# Patient Record
Sex: Male | Born: 1970 | Race: White | Hispanic: No | Marital: Single | Smoking: Current every day smoker
Health system: Southern US, Community
[De-identification: ages and names within clinical notes are randomized; demographics above are authoritative.]

## PROBLEM LIST (undated history)

## (undated) DIAGNOSIS — C801 Malignant (primary) neoplasm, unspecified: Secondary | ICD-10-CM

## (undated) HISTORY — PX: TONSILLECTOMY: SUR1361

## (undated) HISTORY — PX: ADENOIDECTOMY: SUR15

---

## 2000-05-26 ENCOUNTER — Encounter: Payer: Self-pay | Admitting: Internal Medicine

## 2000-05-26 ENCOUNTER — Ambulatory Visit (HOSPITAL_COMMUNITY): Admission: RE | Admit: 2000-05-26 | Discharge: 2000-05-26 | Payer: Self-pay | Admitting: Internal Medicine

## 2003-10-26 ENCOUNTER — Emergency Department (HOSPITAL_COMMUNITY): Admission: EM | Admit: 2003-10-26 | Discharge: 2003-10-26 | Payer: Self-pay | Admitting: Emergency Medicine

## 2004-12-21 ENCOUNTER — Emergency Department (HOSPITAL_COMMUNITY): Admission: EM | Admit: 2004-12-21 | Discharge: 2004-12-21 | Payer: Self-pay | Admitting: *Deleted

## 2006-08-02 IMAGING — CR DG ELBOW COMPLETE 3+V*L*
2 series · 2 of 2 positions shown · non-contrast
Comparison: none

CLINICAL DATA: Patient fell on to his left arm and has pain along the elbow.
 LEFT ELBOW ? 4 VIEW:
 No prior studies.

[view not recorded (1 of 2)]
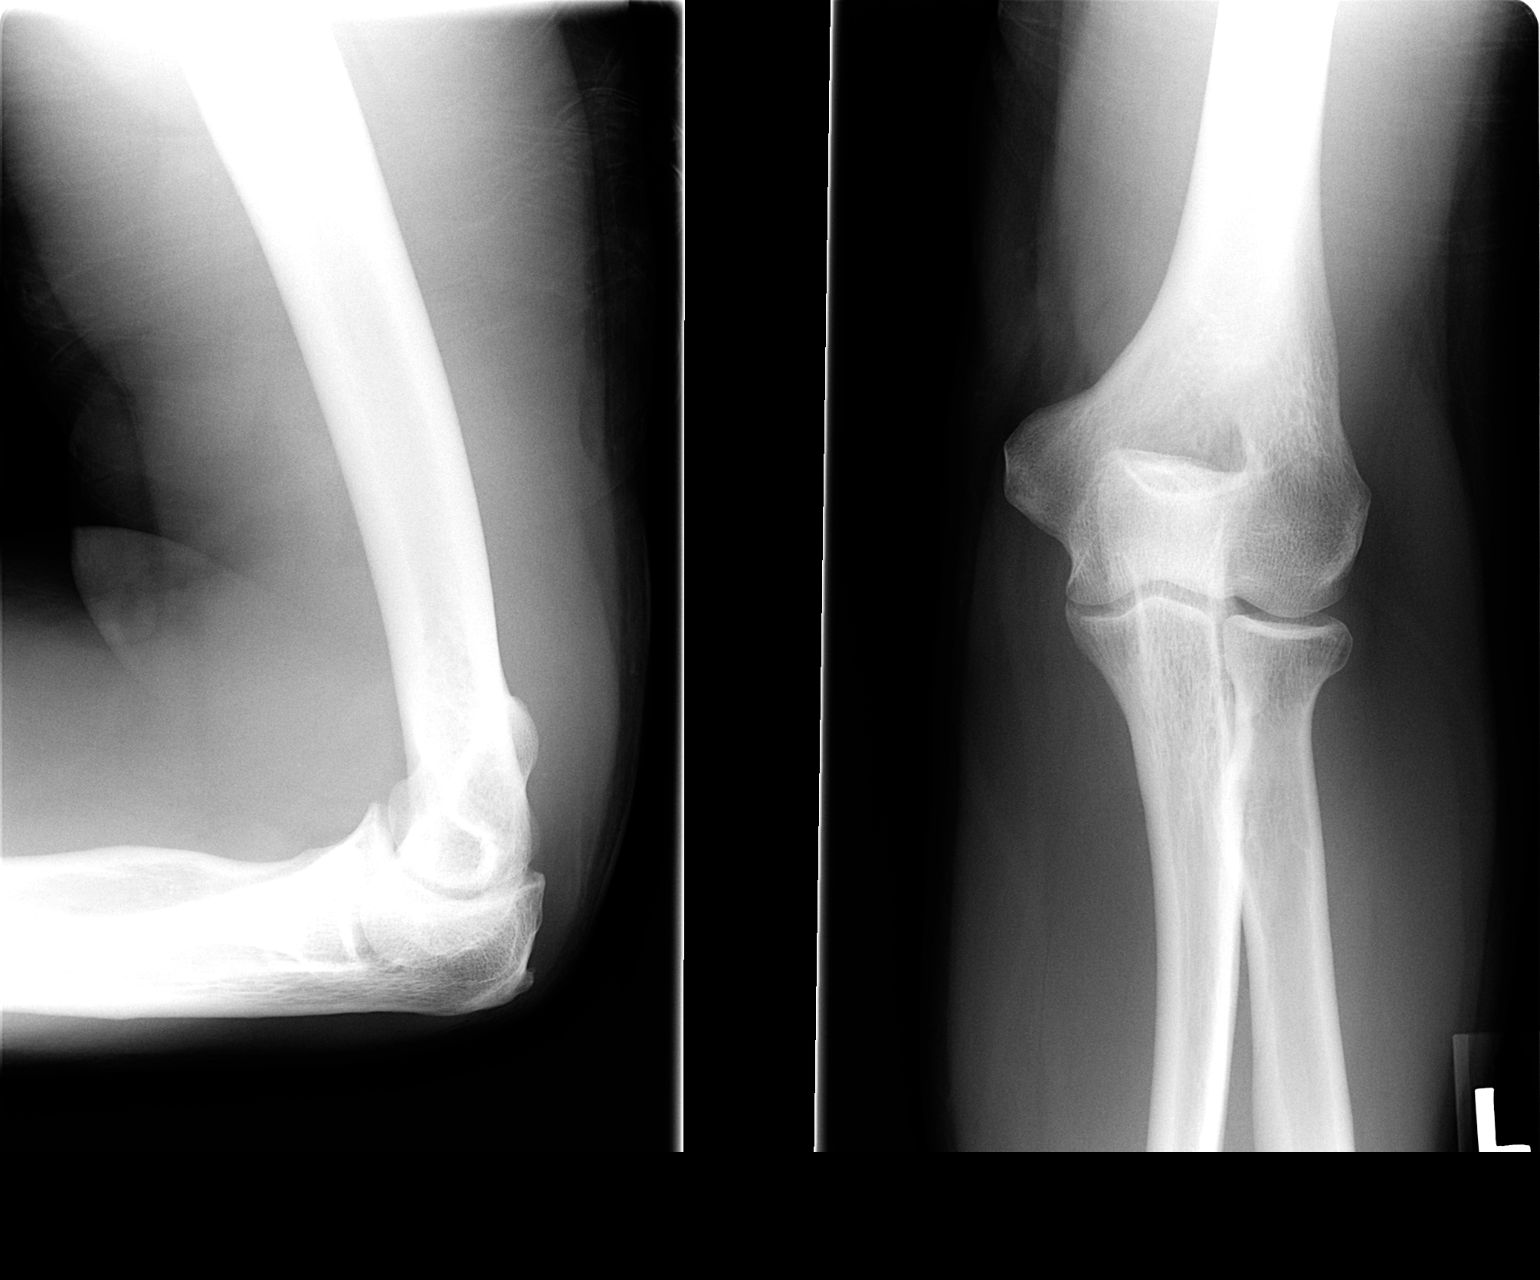

[view not recorded (2 of 2)]
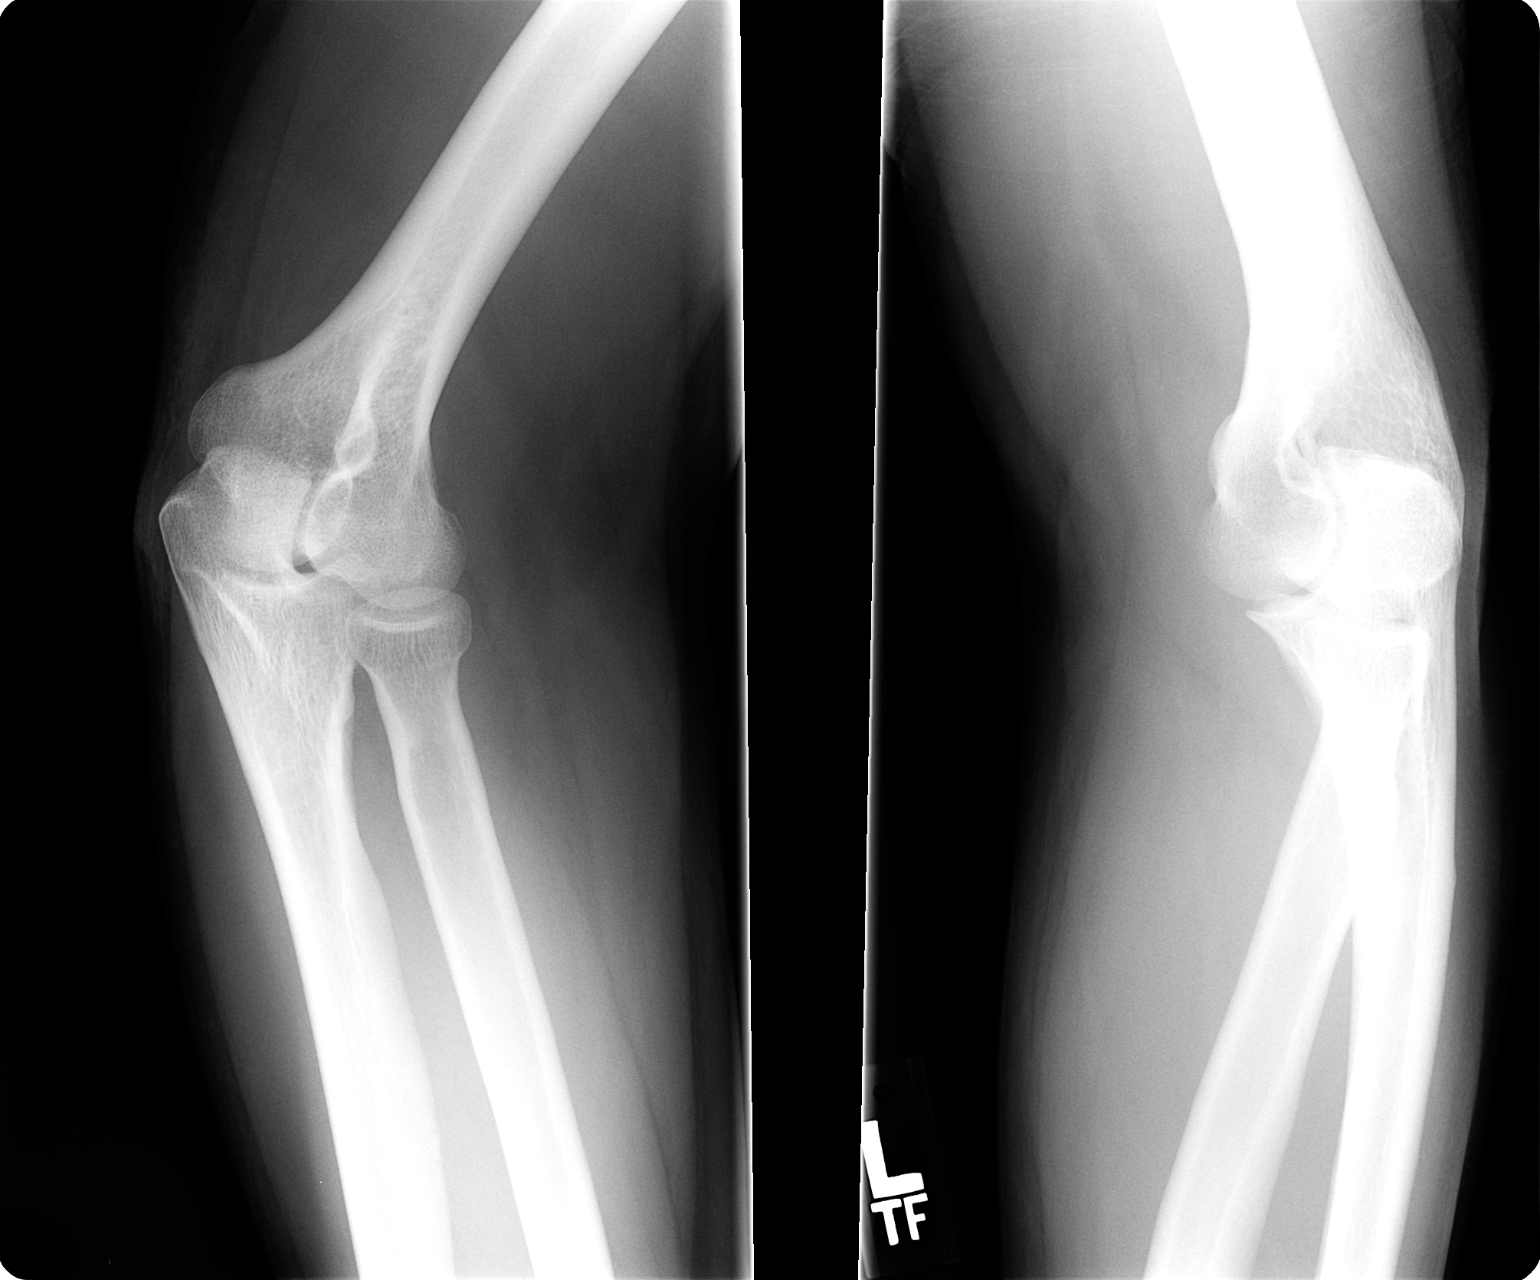

[2 of 2 positions shown; findings below may reference images not displayed]

FINDINGS: Anterior fat pad is slightly indistinct, but does not appear widened.  No posterior fat pad is visible to suggest elbow effusion.  
 There is mild spurring of the olecranon.  
 No radiographically visible fracture or dislocation.
IMPRESSION: No conventional radiographic evidence of fracture or elbow effusion.  If pain persists then follow-up imaging may be warranted.

## 2019-07-10 ENCOUNTER — Emergency Department (HOSPITAL_COMMUNITY): Payer: Medicaid Other

## 2019-07-10 ENCOUNTER — Encounter (HOSPITAL_COMMUNITY): Payer: Self-pay | Admitting: Emergency Medicine

## 2019-07-10 ENCOUNTER — Other Ambulatory Visit: Payer: Self-pay

## 2019-07-10 ENCOUNTER — Emergency Department (HOSPITAL_COMMUNITY)
Admission: EM | Admit: 2019-07-10 | Discharge: 2019-07-10 | Disposition: A | Discharge status: Home / Self Care | Payer: Medicaid Other | Attending: Emergency Medicine | Admitting: Emergency Medicine

## 2019-07-10 DIAGNOSIS — F17210 Nicotine dependence, cigarettes, uncomplicated: Secondary | ICD-10-CM | POA: Insufficient documentation

## 2019-07-10 DIAGNOSIS — C3491 Malignant neoplasm of unspecified part of right bronchus or lung: Secondary | ICD-10-CM | POA: Insufficient documentation

## 2019-07-10 DIAGNOSIS — R0602 Shortness of breath: Secondary | ICD-10-CM

## 2019-07-10 DIAGNOSIS — Z7901 Long term (current) use of anticoagulants: Secondary | ICD-10-CM | POA: Diagnosis not present

## 2019-07-10 HISTORY — DX: Malignant (primary) neoplasm, unspecified: C80.1

## 2019-07-10 LAB — CBC WITH DIFFERENTIAL/PLATELET
Abs Immature Granulocytes: 0.53 10*3/uL — ABNORMAL HIGH (ref 0.00–0.07)
Basophils Absolute: 0 10*3/uL (ref 0.0–0.1)
Basophils Relative: 1 %
Eosinophils Absolute: 0 10*3/uL (ref 0.0–0.5)
Eosinophils Relative: 0 %
HCT: 27.2 % — ABNORMAL LOW (ref 39.0–52.0)
Hemoglobin: 8.8 g/dL — ABNORMAL LOW (ref 13.0–17.0)
Immature Granulocytes: 15 %
Lymphocytes Relative: 19 %
Lymphs Abs: 0.7 10*3/uL (ref 0.7–4.0)
MCH: 31.7 pg (ref 26.0–34.0)
MCHC: 32.4 g/dL (ref 30.0–36.0)
MCV: 97.8 fL (ref 80.0–100.0)
Monocytes Absolute: 0.6 10*3/uL (ref 0.1–1.0)
Monocytes Relative: 16 %
Neutro Abs: 1.7 10*3/uL (ref 1.7–7.7)
Neutrophils Relative %: 49 %
Platelets: 139 10*3/uL — ABNORMAL LOW (ref 150–400)
RBC: 2.78 MIL/uL — ABNORMAL LOW (ref 4.22–5.81)
RDW: 21.9 % — ABNORMAL HIGH (ref 11.5–15.5)
WBC: 3.5 10*3/uL — ABNORMAL LOW (ref 4.0–10.5)
nRBC: 13.5 % — ABNORMAL HIGH (ref 0.0–0.2)

## 2019-07-10 LAB — COMPREHENSIVE METABOLIC PANEL
ALT: 49 U/L — ABNORMAL HIGH (ref 0–44)
AST: 26 U/L (ref 15–41)
Albumin: 3.4 g/dL — ABNORMAL LOW (ref 3.5–5.0)
Alkaline Phosphatase: 61 U/L (ref 38–126)
Anion gap: 11 (ref 5–15)
BUN: 34 mg/dL — ABNORMAL HIGH (ref 6–20)
CO2: 28 mmol/L (ref 22–32)
Calcium: 8.6 mg/dL — ABNORMAL LOW (ref 8.9–10.3)
Chloride: 100 mmol/L (ref 98–111)
Creatinine, Ser: 0.64 mg/dL (ref 0.61–1.24)
GFR calc Af Amer: >60 mL/min (ref >60)
GFR calc non Af Amer: >60 mL/min (ref >60)
Glucose, Bld: 117 mg/dL — ABNORMAL HIGH (ref 70–99)
Potassium: 4 mmol/L (ref 3.5–5.1)
Sodium: 139 mmol/L (ref 135–145)
Total Bilirubin: 0.6 mg/dL (ref 0.3–1.2)
Total Protein: 5.9 g/dL — ABNORMAL LOW (ref 6.5–8.1)

## 2019-07-10 LAB — LIPASE, BLOOD: Lipase: 16 U/L (ref 11–51)

## 2019-07-10 MED ORDER — IOHEXOL 350 MG/ML SOLN
100.0000 mL | Freq: Once | INTRAVENOUS | Status: AC | PRN
  Administered 2019-07-10: 100 mL via INTRAVENOUS

## 2019-07-10 MED ORDER — HEPARIN SOD (PORK) LOCK FLUSH 100 UNIT/ML IV SOLN
500.0000 [IU] | Freq: Once | INTRAVENOUS | Status: AC
  Administered 2019-07-10: 500 [IU] via INTRAVENOUS
  Filled 2019-07-10: qty 5

## 2019-07-10 NOTE — ED Triage Notes (Signed)
Patient c/o shortness of breath. Per patient has stage 4 lung cancer and gets short of breath with heat. Per patient has inhaler he uses once a day but makes shortness of breath worse. Patient has swelling in arms and legs bilaterally. Patient states side effect of chemo. Last Chemo treatment 3 weeks ago. Patient is to start new chemo session this Wednesday. Patient denies any chest pain or fevers. Patient does have congested cough with thick yellow sputum.

## 2019-07-10 NOTE — ED Provider Notes (Signed)
[facility] EMERGENCY DEPARTMENT Provider Note   CSN: [id number] Arrival date & time: 07/10/19  1556     History Chief Complaint  Patient presents with  . Shortness of Breath    Eddie English is a 49 y.o. male.  Patient has a history of metastatic small cell cancer undergoing chemotherapy.  This is being done at [facility] by [place].  Patient presenting here with a complaint of shortness of breath.  Patient states there is a congestive cough with thick yellow sputum.  Vital signs here afebrile.  Blood pressure 956 systolic.  Heart rate 96 respirations 18.  Oxygen sats 96%.  Patient is mostly concerned about diffuse swelling to upper extremities and lower extremities.  But he was told by his hematologist oncologist that is due to the chemo.  But he thinks is excessive.        Past Medical History:  Diagnosis Date  . Cancer ([place])     There are no problems to display for this patient.   Past Surgical History:  Procedure Laterality Date  . ADENOIDECTOMY    . TONSILLECTOMY         History reviewed. No pertinent family history.  Social History   Tobacco Use  . Smoking status: Current Every Day Smoker    Packs/day: 0.03    Years: 32.00    Pack years: 0.96    Types: Cigarettes  . Smokeless tobacco: Former Network engineer  . Vaping Use: Never used  Substance Use Topics  . Alcohol use: Not Currently  . Drug use: Never    Home Medications Prior to Admission medications   Medication Sig Start Date End Date Taking? Authorizing Provider  benzonatate (TESSALON) 100 MG capsule Take 1 capsule by mouth 3 (three) times daily. 05/21/19  Yes [provider]  dexamethasone (DECADRON) 4 MG tablet Take 4 mg by mouth 2 (two) times daily with a meal.   Yes [provider]  metoprolol tartrate (LOPRESSOR) 25 MG tablet Take 1 tablet by mouth 2 (two) times daily. 05/10/19  Yes [provider]  Rivaroxaban (XARELTO) 15 MG TABS tablet Take 15 mg by mouth 2  (two) times daily with a meal.   Yes [provider]  traMADol (ULTRAM) 50 MG tablet Take 50 mg by mouth every 6 (six) hours as needed.   Yes [provider]    Allergies    Patient has no known allergies.  Review of Systems   Review of Systems  Constitutional: Negative for chills and fever.  HENT: Negative for congestion, rhinorrhea and sore throat.   Eyes: Negative for visual disturbance.  Respiratory: Positive for shortness of breath. Negative for cough.   Cardiovascular: Positive for leg swelling. Negative for chest pain.  Gastrointestinal: Negative for abdominal pain, diarrhea, nausea and vomiting.  Genitourinary: Negative for dysuria.  Musculoskeletal: Negative for back pain and neck pain.  Skin: Negative for rash.  Neurological: Negative for dizziness, light-headedness and headaches.  Hematological: Does not bruise/bleed easily.  Psychiatric/Behavioral: Negative for confusion.    Physical Exam Updated Vital Signs BP 110/72   Pulse 100   Temp 97.7 F (36.5 C) (Oral)   Resp 13   Ht 1.778 m (5\' 10" )   Wt 78 kg   SpO2 100%   BMI 24.68 kg/m   Physical Exam Vitals and nursing note reviewed.  Constitutional:      Appearance: Normal appearance. He is well-developed.  HENT:     Head: Normocephalic and atraumatic.  Eyes:  Extraocular Movements: Extraocular movements intact.     Conjunctiva/sclera: Conjunctivae normal.     Pupils: Pupils are equal, round, and reactive to light.  Cardiovascular:     Rate and Rhythm: Normal rate and regular rhythm.     Heart sounds: No murmur heard.   Pulmonary:     Effort: Pulmonary effort is normal. No respiratory distress.     Breath sounds: Normal breath sounds.  Chest:     Chest wall: No tenderness.  Abdominal:     Palpations: Abdomen is soft.     Tenderness: There is no abdominal tenderness.  Musculoskeletal:        General: Swelling present. Normal range of motion.     Cervical back: Neck supple.    Skin:    General: Skin is warm and dry.     Capillary Refill: Capillary refill takes less than 2 seconds.  Neurological:     General: No focal deficit present.     Mental Status: He is alert and oriented to person, place, and time.     ED Results / Procedures / Treatments   Labs (all labs ordered are listed, but only abnormal results are displayed) Labs Reviewed  CBC WITH DIFFERENTIAL/PLATELET - Abnormal; Notable for the following components:      Result Value   WBC 3.5 (*)    RBC 2.78 (*)    Hemoglobin 8.8 (*)    HCT 27.2 (*)    RDW 21.9 (*)    Platelets 139 (*)    nRBC 13.5 (*)    Abs Immature Granulocytes 0.53 (*)    All other components within normal limits  COMPREHENSIVE METABOLIC PANEL - Abnormal; Notable for the following components:   Glucose, Bld 117 (*)    BUN 34 (*)    Calcium 8.6 (*)    Total Protein 5.9 (*)    Albumin 3.4 (*)    ALT 49 (*)    All other components within normal limits  LIPASE, BLOOD    EKG EKG Interpretation  Date/Time:  Saturday July 10 2019 16:31:59 EDT Ventricular Rate:  94 PR Interval:    QRS Duration: 89 QT Interval:  343 QTC Calculation: 429 R Axis:   57 Text Interpretation: Sinus rhythm Low voltage, precordial leads No previous ECGs available Confirmed by Fredia Sorrow [phone]) on 07/10/2019 4:38:04 PM   Radiology CT Angio Chest PE W/Cm &/Or Wo Cm  Result Date: 07/10/2019 CLINICAL DATA:  Shortness of breath.  Lung cancer. EXAM: CT ANGIOGRAPHY CHEST WITH CONTRAST TECHNIQUE: Multidetector CT imaging of the chest was performed using the standard protocol during bolus administration of intravenous contrast. Multiplanar CT image reconstructions and MIPs were obtained to evaluate the vascular anatomy. CONTRAST:  128mL OMNIPAQUE IOHEXOL 350 MG/ML SOLN COMPARISON:  Chest x-ray today FINDINGS: Cardiovascular: No filling defects in the pulmonary arteries to suggest pulmonary emboli. Heart is normal size. Aorta is normal caliber.  Mediastinum/Nodes: Abnormal infiltrating soft tissue in the right paratracheal region and right hilum without well-defined measurable nodes. Trachea and esophagus are unremarkable. Thyroid unremarkable. Lungs/Pleura: Band like scarring or atelectasis in the right upper lobe. Lungs otherwise clear. No effusions. Upper Abdomen: Multiple low-density lesions seen in the visualized liver, the largest measuring up to 6.8 cm compatible with metastases. Low-density areas in the spleen could reflect focal lesions or pseudo lesions based on early arterial phase imaging. Musculoskeletal: Irregular, predominantly sclerotic sternum. Sclerotic lesions noted within the mid and lower thoracic spine. Sclerotic lesion within the posterior right 10th rib. Review  of the MIP images confirms the above findings. IMPRESSION: No evidence of pulmonary embolus. Infiltrative soft tissue in the right paratracheal region, subcarinal region and right hilum. This could reflect tumor or post treatment changes. Band like atelectasis or scarring in the right upper lobe may be post treatment related. Multiple bony sclerotic lesions involving the sternum, thoracic spine, and posterior right 10th rib compatible with metastases. Multiple hepatic metastases. Electronically Signed   By: Rolm Baptise M.D.   On: 07/10/2019 20:52   DG Chest Port 1 View  Result Date: 07/10/2019 CLINICAL DATA:  Shortness of breath, cough productive of thick yellow sputum, history stage IV RIGHT-side lung cancer, swelling in arms and legs, last chemotherapy treatment 3 weeks ago, smoker EXAM: PORTABLE CHEST 1 VIEW COMPARISON:  Portable exam 1738 hours without priors for comparison FINDINGS: RIGHT jugular Port-A-Cath with tip projecting over cavoatrial junction. Normal heart size, mediastinal contours, and pulmonary vascularity. Volume loss in the RIGHT upper lobe with atelectasis and abnormal medial upper lobe density question related to reported tumor. Remaining lungs  clear. No acute infiltrate, pleural effusion, or pneumothorax. Osseous structures unremarkable. IMPRESSION: Volume loss and abnormal density in the RIGHT upper lobe likely reflecting tumor and atelectasis related to treatment; recommend correlation with prior imaging to confirm this is site of patient's known tumor. No acute infiltrates identified. Electronically Signed   By: Lavonia Dana M.D.   On: 07/10/2019 18:07    Procedures Procedures (including critical care time)  Medications Ordered in ED Medications  iohexol (OMNIPAQUE) 350 MG/ML injection 100 mL (100 mLs Intravenous Contrast Given 07/10/19 2028)    ED Course  I have reviewed the triage vital signs and the nursing notes.  Pertinent labs & imaging results that were available during my care of the patient were reviewed by me and considered in my medical decision making (see chart for details).    MDM Rules/Calculators/A&P                          Patient nontoxic no acute distress here.  Proceeded to do CT angio chest to further evaluate and rule out pulmonary embolus since he has a history of cancer.  That was negative.  Does not seem to be anything acute on the CT chest.  Patient's renal function without significant abnormalities.  Does have an anemia.  And some leukopenia.  But his absolute neutrophils are okay.  No fevers here.  Safe for discharge home follow-up with his hematologist oncologist about the extremity swelling.  Final Clinical Impression(s) / ED Diagnoses Final diagnoses:  SOB (shortness of breath)    Rx / DC Orders ED Discharge Orders    None       Fredia Sorrow, MD 07/10/19 2110

## 2019-07-10 NOTE — ED Notes (Signed)
Pt returned from CT Scan 

## 2019-07-10 NOTE — Discharge Instructions (Addendum)
Work-up for the shortness of breath without any acute findings including CT angio chest with no evidence of blood clot.  You do have the small cell cancer and you do have evidence of metastatic disease to the liver this was all known before.  Call your hematologist oncologist on Monday about the arm and leg swelling.  Return for any new or worse symptoms.

## 2019-07-20 ENCOUNTER — Other Ambulatory Visit (HOSPITAL_COMMUNITY): Payer: Self-pay | Admitting: Hematology and Oncology

## 2019-07-20 ENCOUNTER — Other Ambulatory Visit: Payer: Self-pay | Admitting: Hematology and Oncology

## 2019-07-20 DIAGNOSIS — C3490 Malignant neoplasm of unspecified part of unspecified bronchus or lung: Secondary | ICD-10-CM

## 2019-08-03 ENCOUNTER — Ambulatory Visit (HOSPITAL_COMMUNITY): Payer: Medicaid Other

## 2020-04-14 DEATH — deceased

## 2021-02-18 IMAGING — CT CT ANGIO CHEST
2 of 6 series · 18 of 46 positions shown · IV contrast (Omnipaque or Isovue)
Comparison: Chest x-ray today

CLINICAL DATA: Shortness of breath.  Lung cancer.

EXAM:
CT ANGIOGRAPHY CHEST WITH CONTRAST
TECHNIQUE: Multidetector CT imaging of the chest was performed using the
standard protocol during bolus administration of intravenous
contrast. Multiplanar CT image reconstructions and MIPs were
obtained to evaluate the vascular anatomy.
CONTRAST:  100mL OMNIPAQUE IOHEXOL 350 MG/ML SOLN

[Series 5: pe axial thins · axial · 0.89mm/px · z∈[+1327,+1590]mm · 15 of 288 slices shown]
[im 13/288  lung]
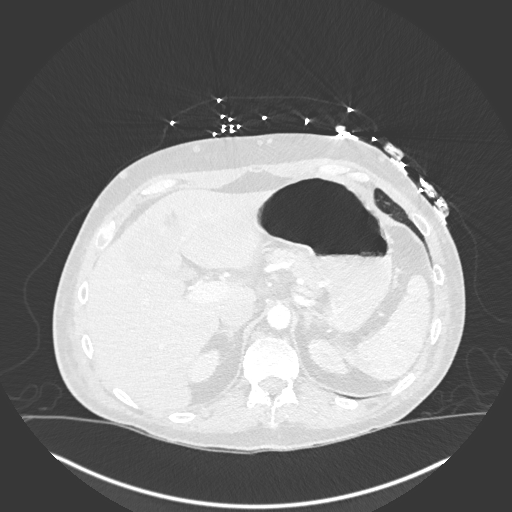
[im 38/288  soft-tissue]
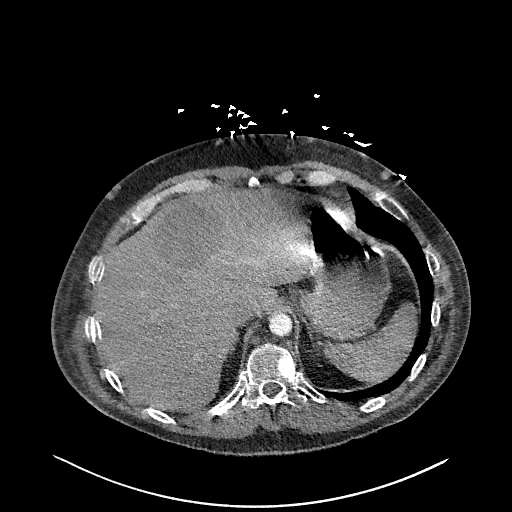
[im 50/288  lung]
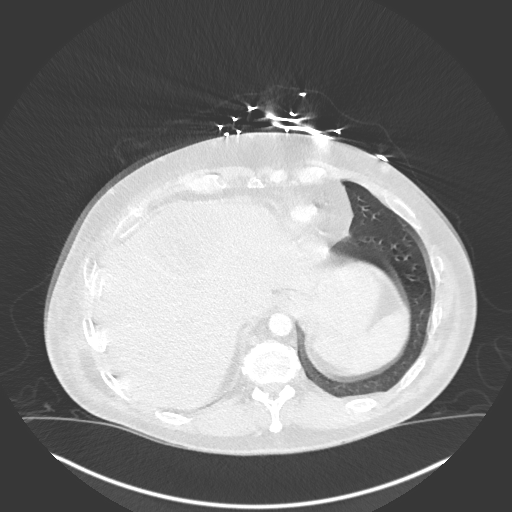
[im 75/288  soft-tissue]
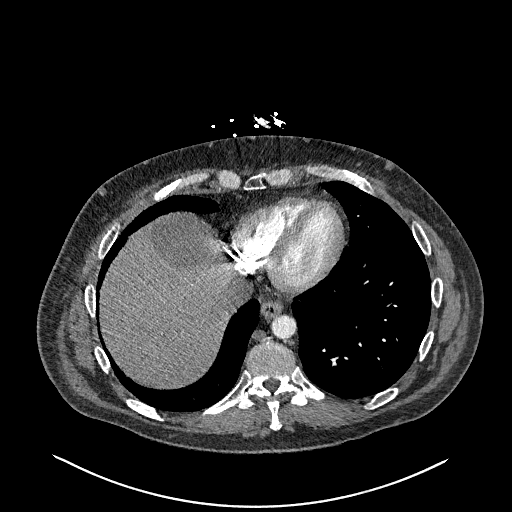
[im 88/288  lung]
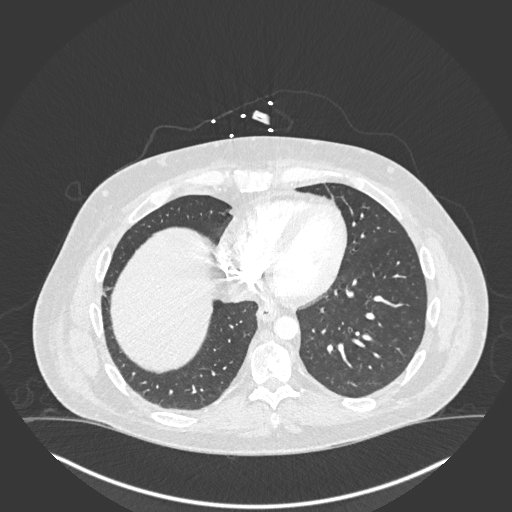
[im 113/288  soft-tissue]
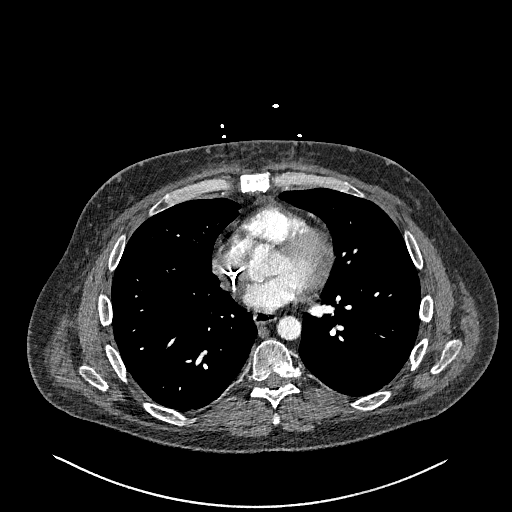
[im 125/288  lung]
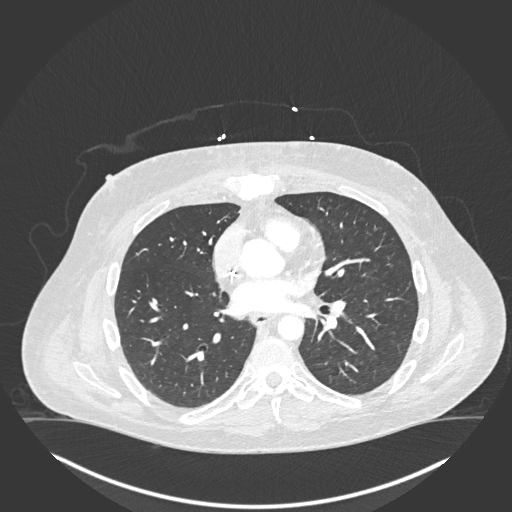
[im 150/288  soft-tissue]
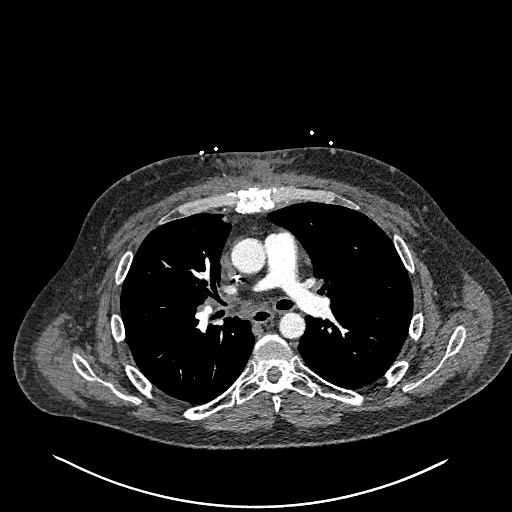
[im 163/288  lung]
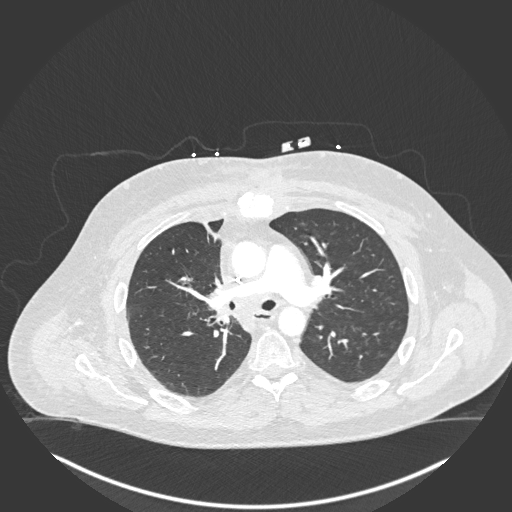
[im 175/288  soft-tissue]
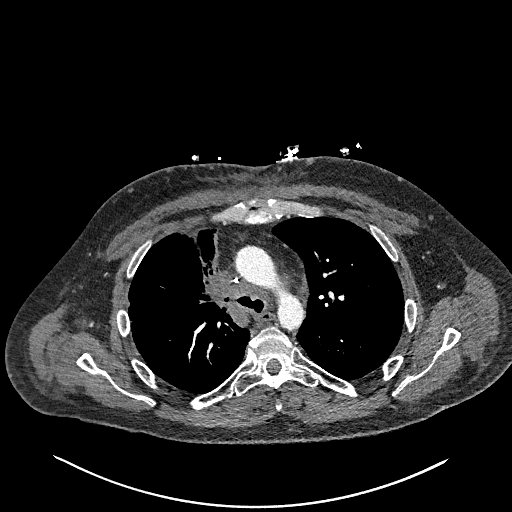
[im 200/288  lung]
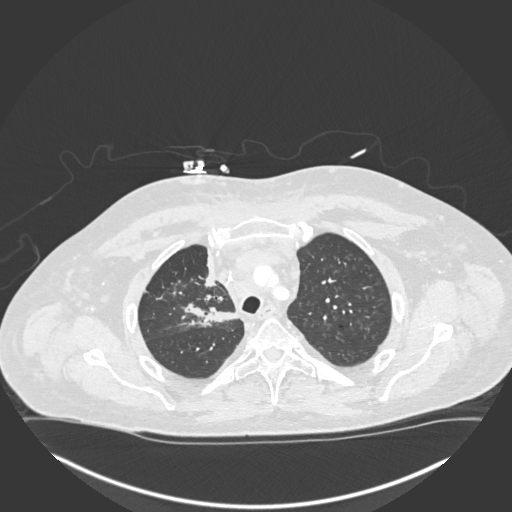
[im 213/288  soft-tissue]
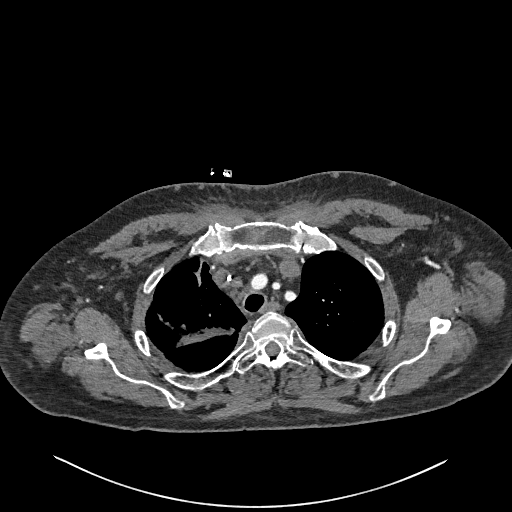
[im 238/288  lung]
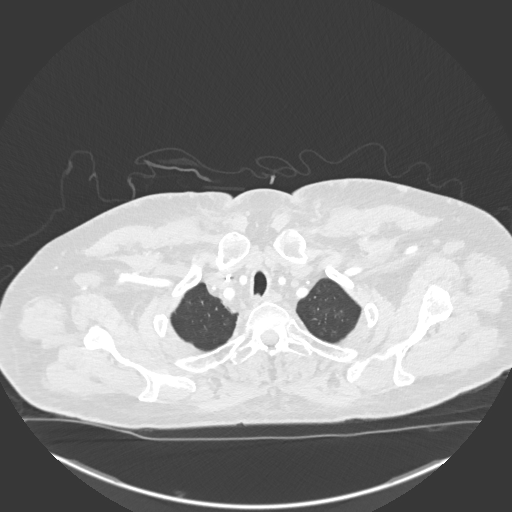
[im 250/288  soft-tissue]
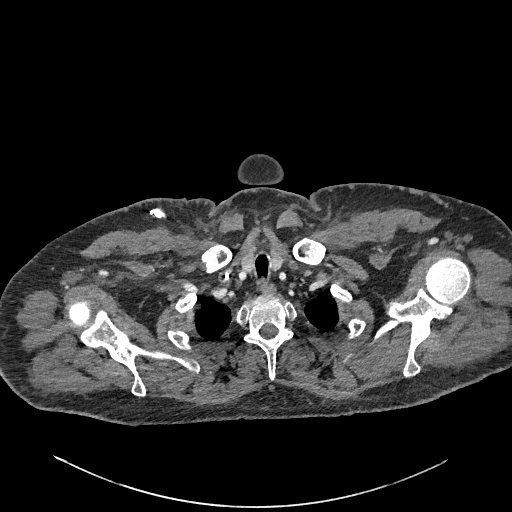
[im 275/288  lung]
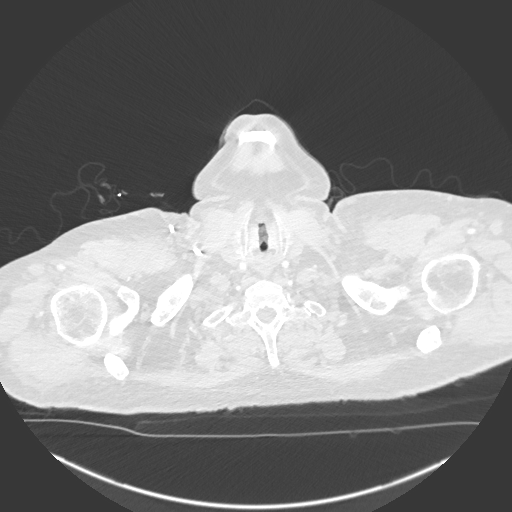

[Series 7: cor soft · coronal · 0.61mm/px · 3 of 166 slices shown]
[im 42/166  soft-tissue]
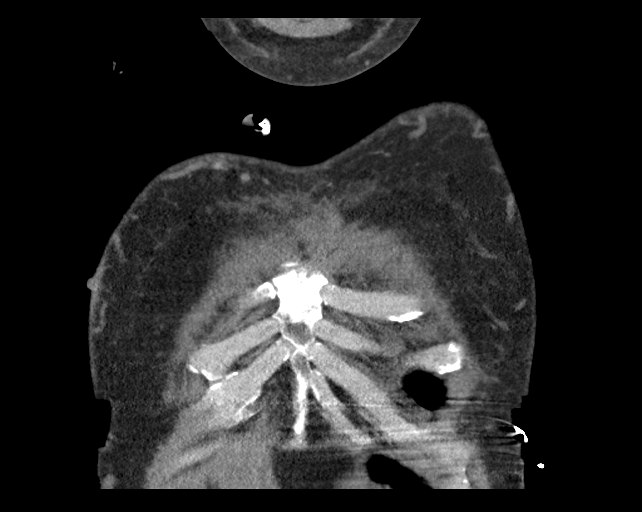
[im 83/166  soft-tissue]
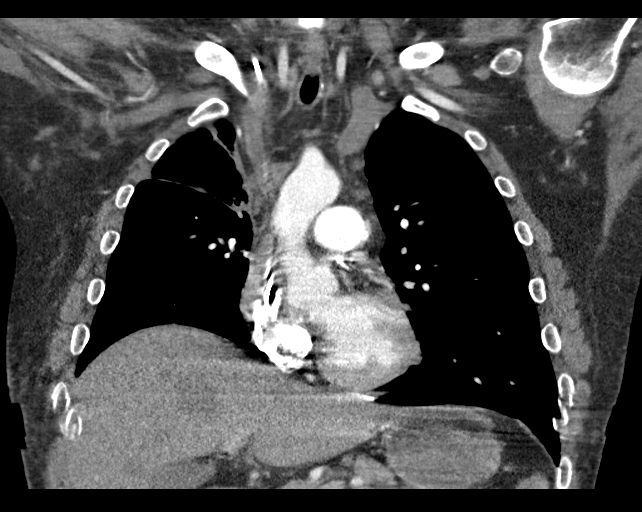
[im 124/166  soft-tissue]
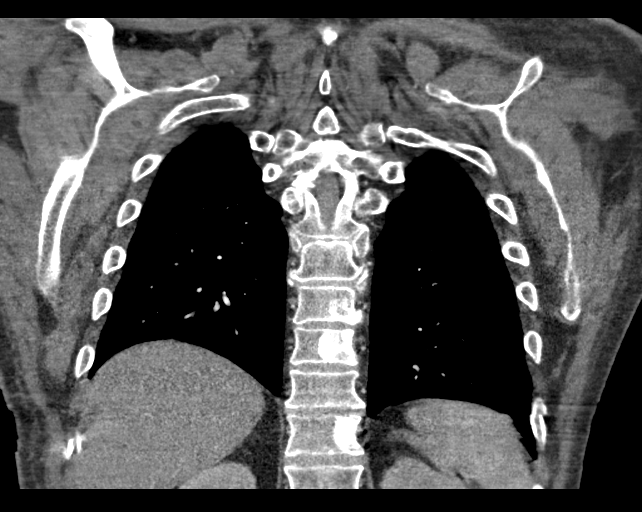

[18 of 46 positions shown; findings below may reference images not displayed]

FINDINGS: Cardiovascular: No filling defects in the pulmonary arteries to
suggest pulmonary emboli. Heart is normal size. Aorta is normal
caliber.

Mediastinum/Nodes: Abnormal infiltrating soft tissue in the right
paratracheal region and right hilum without well-defined measurable
nodes. Trachea and esophagus are unremarkable. Thyroid unremarkable.

Lungs/Pleura: Band like scarring or atelectasis in the right upper
lobe. Lungs otherwise clear. No effusions.

Upper Abdomen: Multiple low-density lesions seen in the visualized
liver, the largest measuring up to 6.8 cm compatible with
metastases. Low-density areas in the spleen could reflect focal
lesions or pseudo lesions based on early arterial phase imaging.

Musculoskeletal: Irregular, predominantly sclerotic sternum.
Sclerotic lesions noted within the mid and lower thoracic spine.
Sclerotic lesion within the posterior right 10th rib.

Review of the MIP images confirms the above findings.
IMPRESSION: No evidence of pulmonary embolus.

Infiltrative soft tissue in the right paratracheal region,
subcarinal region and right hilum. This could reflect tumor or post
treatment changes. Band like atelectasis or scarring in the right
upper lobe may be post treatment related.

Multiple bony sclerotic lesions involving the sternum, thoracic
spine, and posterior right 10th rib compatible with metastases.

Multiple hepatic metastases.

## 2021-02-18 IMAGING — DX DG CHEST 1V PORT
1 series · 1 of 1 positions shown · non-contrast
Comparison: Portable exam 3035 hours without priors for comparison

CLINICAL DATA: Shortness of breath, cough productive of thick
yellow sputum, history stage IV RIGHT-side lung cancer, swelling in
arms and legs, last chemotherapy treatment 3 weeks ago, smoker

EXAM:
PORTABLE CHEST 1 VIEW

[chest ap]
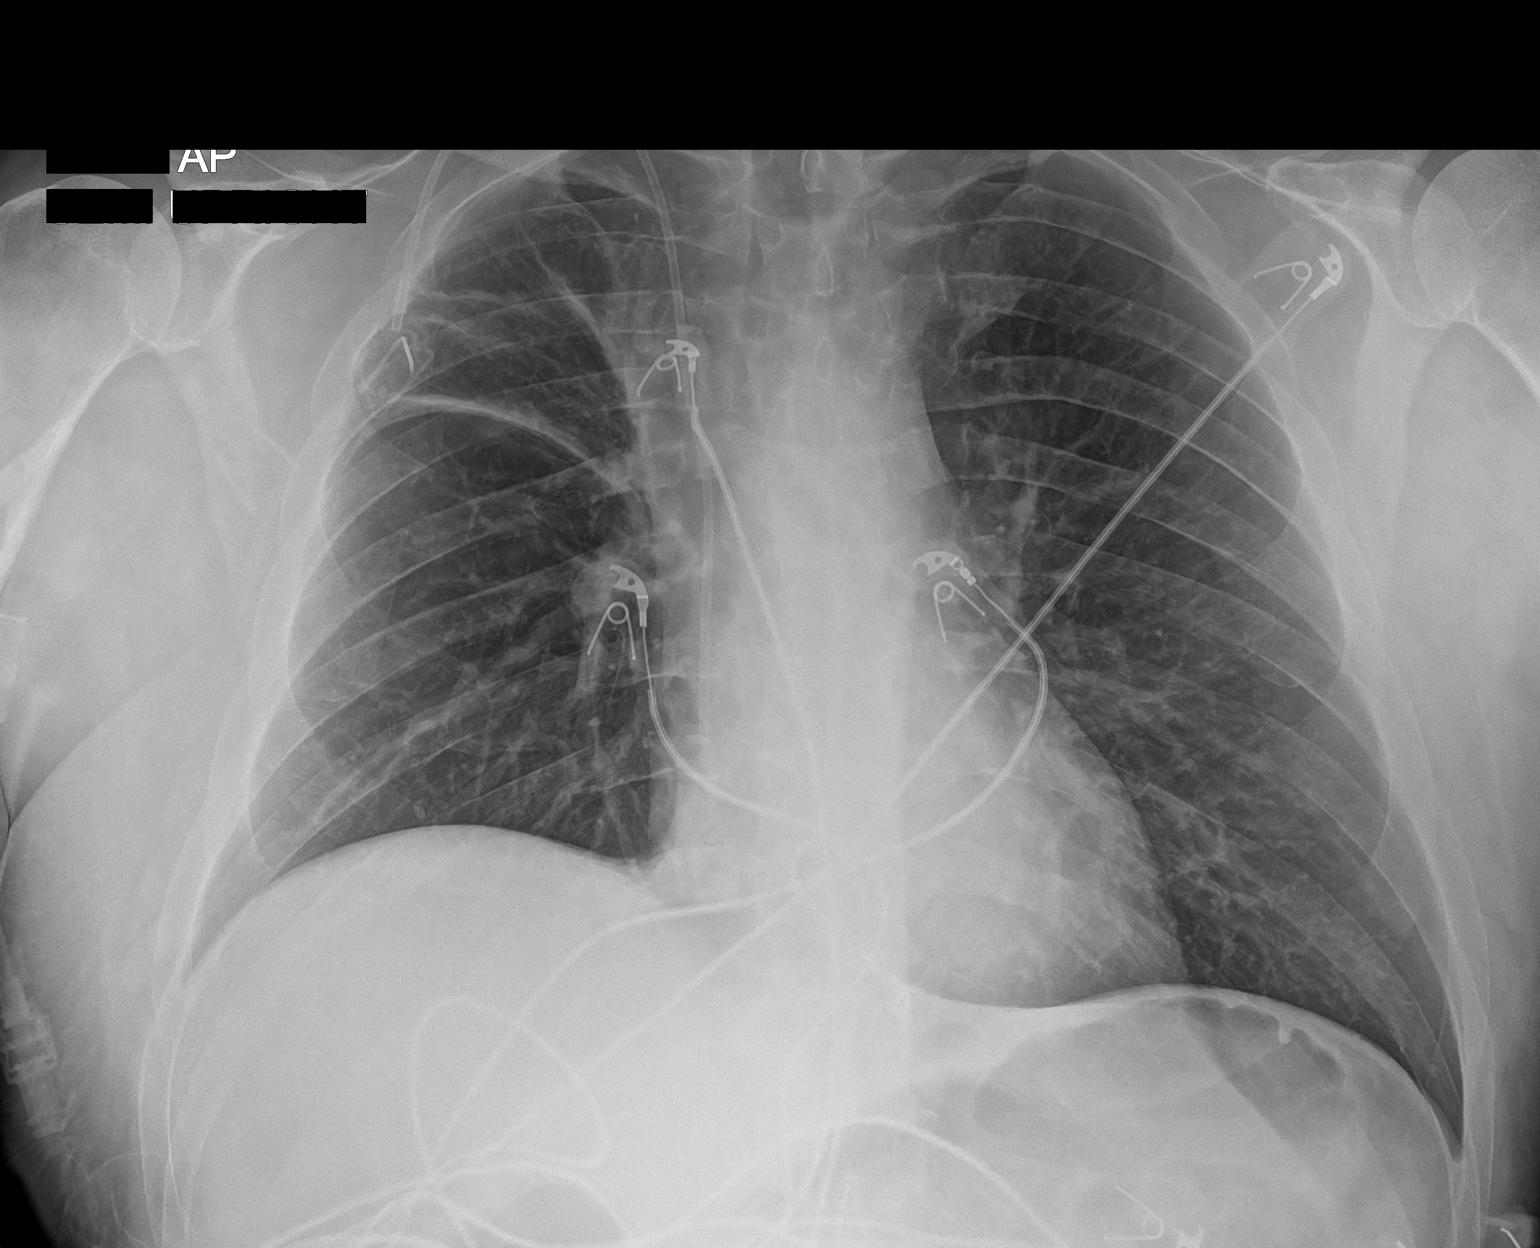

[1 of 1 positions shown; findings below may reference images not displayed]

FINDINGS: RIGHT jugular Port-A-Cath with tip projecting over cavoatrial
junction.

Normal heart size, mediastinal contours, and pulmonary vascularity.

Volume loss in the RIGHT upper lobe with atelectasis and abnormal
medial upper lobe density question related to reported tumor.

Remaining lungs clear.

No acute infiltrate, pleural effusion, or pneumothorax.

Osseous structures unremarkable.
IMPRESSION: Volume loss and abnormal density in the RIGHT upper lobe likely
reflecting tumor and atelectasis related to treatment; recommend
correlation with prior imaging to confirm this is site of patient's
known tumor.

No acute infiltrates identified.
